# Patient Record
Sex: Female | Born: 1975 | Hispanic: Yes | Marital: Single | Smoking: Never smoker
Health system: Southern US, Community
[De-identification: ages and names within clinical notes are randomized; demographics above are authoritative.]

## PROBLEM LIST (undated history)

## (undated) DIAGNOSIS — F411 Generalized anxiety disorder: Secondary | ICD-10-CM

## (undated) DIAGNOSIS — E119 Type 2 diabetes mellitus without complications: Secondary | ICD-10-CM

## (undated) DIAGNOSIS — F909 Attention-deficit hyperactivity disorder, unspecified type: Secondary | ICD-10-CM

---

## 2013-11-24 ENCOUNTER — Ambulatory Visit: Payer: Self-pay

## 2013-11-24 LAB — CBC WITH DIFFERENTIAL/PLATELET
Basophil %: 1 %
Eosinophil #: 0.1 10*3/uL (ref 0.0–0.7)
HGB: 13.2 g/dL (ref 12.0–16.0)
Lymphocyte #: 2.6 10*3/uL (ref 1.0–3.6)
Lymphocyte %: 36.8 %
MCHC: 34.1 g/dL (ref 32.0–36.0)
Monocyte #: 0.5 x10 3/mm (ref 0.2–0.9)
Monocyte %: 7.8 %
Neutrophil #: 3.7 10*3/uL (ref 1.4–6.5)
Neutrophil %: 52.6 %
Platelet: 202 10*3/uL (ref 150–440)
RBC: 4.73 10*6/uL (ref 3.80–5.20)
RDW: 13.8 % (ref 11.5–14.5)
WBC: 7 10*3/uL (ref 3.6–11.0)

## 2013-11-24 LAB — DRUG SCREEN, URINE
Barbiturates, Ur Screen: NEGATIVE (ref ?–200)
Cocaine Metabolite,Ur ~~LOC~~: NEGATIVE (ref ?–300)
MDMA (Ecstasy)Ur Screen: NEGATIVE (ref ?–500)
Methadone, Ur Screen: NEGATIVE (ref ?–300)
Tricyclic, Ur Screen: NEGATIVE (ref ?–1000)

## 2013-11-24 LAB — COMPREHENSIVE METABOLIC PANEL
Alkaline Phosphatase: 60 U/L
Anion Gap: 8 (ref 7–16)
BUN: 12 mg/dL (ref 7–18)
Bilirubin,Total: 0.3 mg/dL (ref 0.2–1.0)
Calcium, Total: 9.1 mg/dL (ref 8.5–10.1)
Chloride: 101 mmol/L (ref 98–107)
Co2: 30 mmol/L (ref 21–32)
EGFR (African American): >60
Potassium: 4 mmol/L (ref 3.5–5.1)
SGOT(AST): 19 U/L (ref 15–37)
Sodium: 139 mmol/L (ref 136–145)

## 2013-11-24 LAB — URINALYSIS, COMPLETE
Ketone: NEGATIVE
Nitrite: NEGATIVE

## 2013-11-25 LAB — URINE CULTURE

## 2015-04-12 ENCOUNTER — Ambulatory Visit
Admit: 2015-04-12 | Disposition: A | Discharge status: Home / Self Care | Payer: Self-pay | Attending: Family Medicine | Admitting: Family Medicine

## 2015-10-30 ENCOUNTER — Emergency Department
Admission: EM | Admit: 2015-10-30 | Discharge: 2015-10-30 | Discharge status: Against Medical Advice | Payer: Medicaid Other | Attending: Emergency Medicine | Admitting: Emergency Medicine

## 2015-10-30 DIAGNOSIS — R05 Cough: Secondary | ICD-10-CM | POA: Insufficient documentation

## 2015-10-30 NOTE — ED Notes (Signed)
Patient presents with c/o NP cough since Friday. Denies fever. Has been using OTC medications without relief.

## 2015-11-13 ENCOUNTER — Ambulatory Visit
Admission: EM | Admit: 2015-11-13 | Discharge: 2015-11-13 | Disposition: A | Discharge status: Home / Self Care | Payer: Medicaid Other | Attending: Family Medicine | Admitting: Family Medicine

## 2015-11-13 DIAGNOSIS — J029 Acute pharyngitis, unspecified: Secondary | ICD-10-CM

## 2015-11-13 DIAGNOSIS — B349 Viral infection, unspecified: Secondary | ICD-10-CM

## 2015-11-13 HISTORY — DX: Attention-deficit hyperactivity disorder, unspecified type: F90.9

## 2015-11-13 NOTE — Discharge Instructions (Signed)
Alternate tylenol and ibuprofen Gargle warm salt water Drink warm tea with local [place] honey Steamy shower Rest- fluids Return if not improved/resolved in 7-10 days   Pharyngitis Pharyngitis is redness, pain, and swelling (inflammation) of your pharynx.  CAUSES  Pharyngitis is usually caused by infection. Most of the time, these infections are from viruses (viral) and are part of a cold. However, sometimes pharyngitis is caused by bacteria (bacterial). Pharyngitis can also be caused by allergies. Viral pharyngitis may be spread from person to person by coughing, sneezing, and personal items or utensils (cups, forks, spoons, toothbrushes). Bacterial pharyngitis may be spread from person to person by more intimate contact, such as kissing.  SIGNS AND SYMPTOMS  Symptoms of pharyngitis include:   Sore throat.   Tiredness (fatigue).   Low-grade fever.   Headache.  Joint pain and muscle aches.  Skin rashes.  Swollen lymph nodes.  Plaque-like film on throat or tonsils (often seen with bacterial pharyngitis). DIAGNOSIS  Your health care provider will ask you questions about your illness and your symptoms. Your medical history, along with a physical exam, is often all that is needed to diagnose pharyngitis. Sometimes, a rapid strep test is done. Other lab tests may also be done, depending on the suspected cause.  TREATMENT  Viral pharyngitis will usually get better in 3-4 days without the use of medicine. Bacterial pharyngitis is treated with medicines that kill germs (antibiotics).  HOME CARE INSTRUCTIONS   Drink enough water and fluids to keep your urine clear or pale yellow.   Only take over-the-counter or prescription medicines as directed by your health care provider:   If you are prescribed antibiotics, make sure you finish them even if you start to feel better.   Do not take aspirin.   Get lots of rest.   Gargle with 8 oz of salt water ( tsp of salt per 1 qt of  water) as often as every 1-2 hours to soothe your throat.   Throat lozenges (if you are not at risk for choking) or sprays may be used to soothe your throat. SEEK MEDICAL CARE IF:   You have large, tender lumps in your neck.  You have a rash.  You cough up green, yellow-brown, or bloody spit. SEEK IMMEDIATE MEDICAL CARE IF:   Your neck becomes stiff.  You drool or are unable to swallow liquids.  You vomit or are unable to keep medicines or liquids down.  You have severe pain that does not go away with the use of recommended medicines.  You have trouble breathing (not caused by a stuffy nose). MAKE SURE YOU:   Understand these instructions.  Will watch your condition.  Will get help right away if you are not doing well or get worse.   This information is not intended to replace advice given to you by your health care provider. Make sure you discuss any questions you have with your health care provider.   Document Released: 12/01/2005 Document Revised: 09/21/2013 Document Reviewed: 08/08/2013 Elsevier Interactive Patient Education [employer]2016 Elsevier Inc.

## 2015-11-13 NOTE — ED Provider Notes (Signed)
CSN: [id number]     Arrival date & time 11/13/15  1721 History   First MD Initiated Contact with Patient 11/13/15 1912     Chief Complaint  Patient presents with  . Otalgia   (Consider location/radiation/quality/duration/timing/severity/associated sxs/prior Treatment) HPI  39 yo F recent dx Bronchitis and took Zpack  15-20 November. Now reports  fatigue, chills,since returning form holiday 2 days ago.  . Initially presented that this was persistent since the pre-Thanksgiving episode/pre-atb  but on closer review these new symptoms present only 2 days. Anxious  about tenderness left ant chain. Afebrile. Mild malaise,fatigue  Past Medical History  Diagnosis Date  . ADHD (attention deficit hyperactivity disorder)    Past Surgical History  Procedure Laterality Date  . Cesarean section      2001 and 2013   Family History  Problem Relation Age of Onset  . Heart failure Mother   . Cancer Father    Social History  Substance Use Topics  . Smoking status: Never Smoker   . Smokeless tobacco: None  . Alcohol Use: No   OB History    No data available     Review of Systems Constitutional: No fever.  Eyes: No visual changes. WUJ:WJXBJYNWENT:Scratchy  throat. Cardiovascular:Negative for chest pain/palpitations Respiratory: Negative for shortness of breath Gastrointestinal: No abdominal pain. No nausea,vomiting, diarrhea Genitourinary: Negative for dysuria. Normal urination. Musculoskeletal: Negative for back pain. FROM extremities without pain Skin: Negative for rash Neurological: Negative for headache, focal weakness or numbness  Allergies  Penicillins and Sulfa antibiotics  Home Medications   Prior to Admission medications   Medication Sig Start Date End Date Taking? Authorizing Provider  amphetamine-dextroamphetamine (ADDERALL) 10 MG tablet Take 10 mg by mouth daily with breakfast.   Yes Historical Provider, MD   Meds Ordered and Administered this Visit  Medications - No data to  display  BP 109/71 mmHg  Pulse 78  Temp(Src) 97.8 F (36.6 C) (Tympanic)  Resp 16  Ht 5\' 2"  (1.575 m)  Wt 150 lb (68.04 kg)  BMI 27.43 kg/m2  SpO2 100%  LMP 11/13/2015 (Exact Date) No data found.   Physical Exam   VS noted, WNL  GENERAL : NAD HEENT: no pharyngeal erythema,no exudate, no erythema of TMs, mild left ant cervical tenderness RESP: CTA  B , no wheezing, no accessory muscle use CARD: RRR ABD: Not distended NEURO: Good attention, good recall, no gross neuro defecit PSYCH: speech and behavior appropriate  ED Course  Procedures (including critical care time)  Labs Review Labs Reviewed - No data to displaycancelled  Imaging Review No results found.    MDM   1. Viral syndrome   2. Pharyngitis    Plan:  diagnosis reviewed with patient Treat symptoms - gargle-rest -increase fluids  Recommend supportive treatment with cyclic tylenol and ibuprofen Seek additional medical care if symptoms worsen or are not improving Viral syndrome 7-10 days ,common experience.    Rae HalstedLaurie W Lee, PA-C 11/14/15 1252

## 2015-11-13 NOTE — ED Notes (Signed)
Started Sunday with pain behind left ear and left lateral neck. Pain with swallowing and touch

## 2015-11-14 ENCOUNTER — Encounter: Payer: Self-pay | Admitting: Physician Assistant
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
## 2016-06-04 ENCOUNTER — Other Ambulatory Visit: Payer: Self-pay | Admitting: Pediatrics

## 2016-06-04 ENCOUNTER — Ambulatory Visit
Admission: RE | Admit: 2016-06-04 | Discharge: 2016-06-04 | Disposition: A | Discharge status: Home / Self Care | Payer: Medicaid Other | Source: Ambulatory Visit | Attending: Pediatrics | Admitting: Pediatrics

## 2016-06-04 DIAGNOSIS — R1011 Right upper quadrant pain: Secondary | ICD-10-CM | POA: Insufficient documentation

## 2017-11-23 IMAGING — US US ABDOMEN LIMITED
1 series · 14 of 25 positions shown · non-contrast
Comparison: None.

CLINICAL DATA: Right upper quadrant abdominal pain for 4 days

EXAM:
US ABDOMEN LIMITED - RIGHT UPPER QUADRANT

[Series 1: us abdomen limited · 0.13mm/px · 14 of 63 slices shown]
[im 1/63]
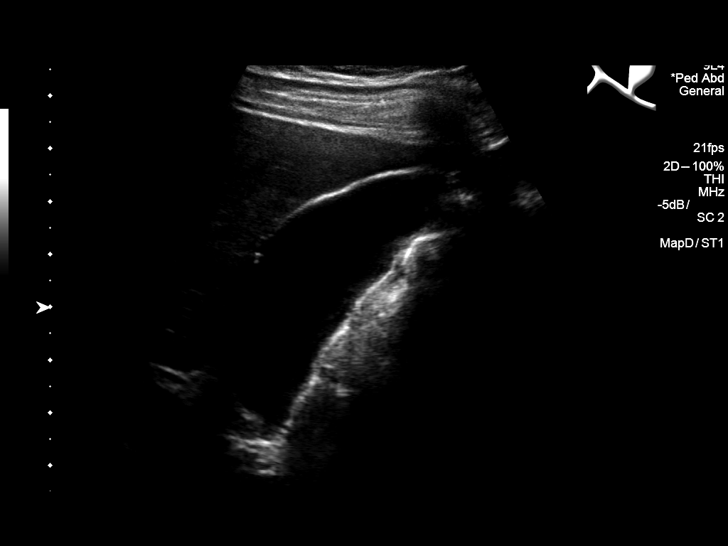
[im 6/63]
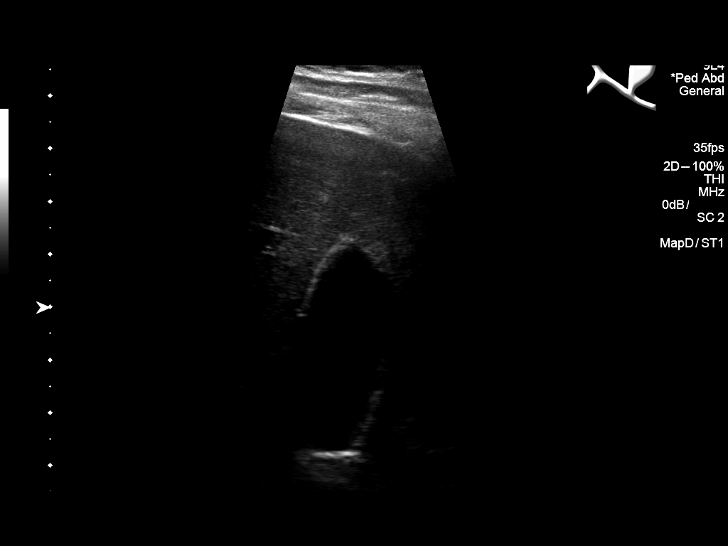
[im 11/63]
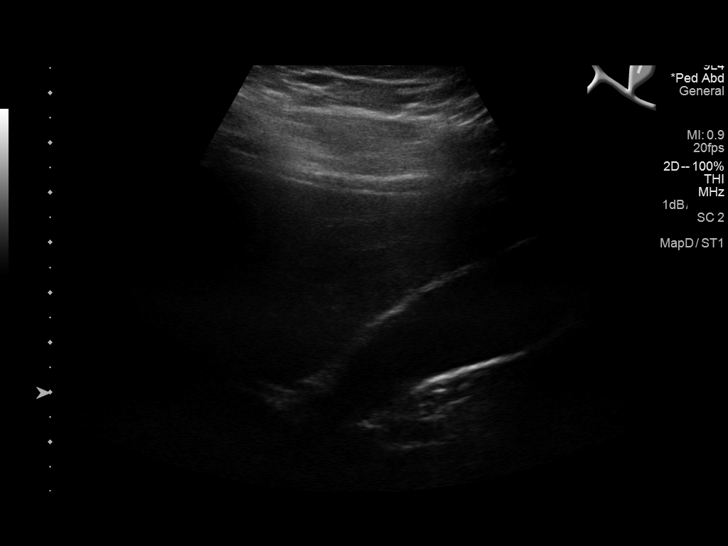
[im 16/63]
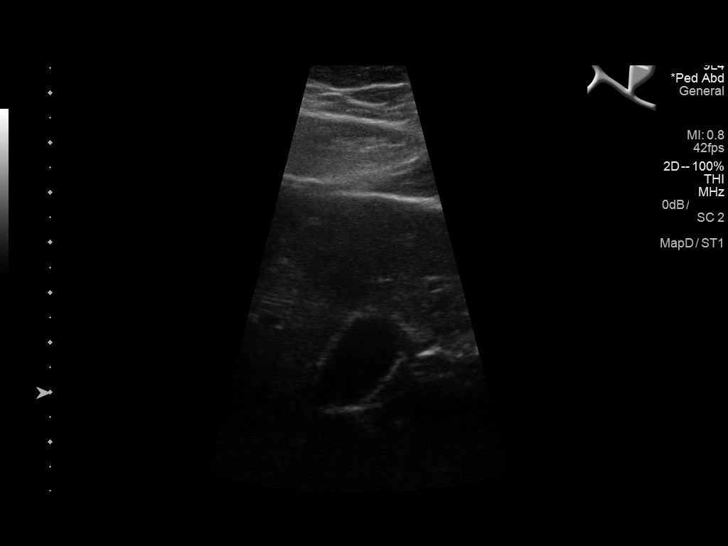
[im 21/63]
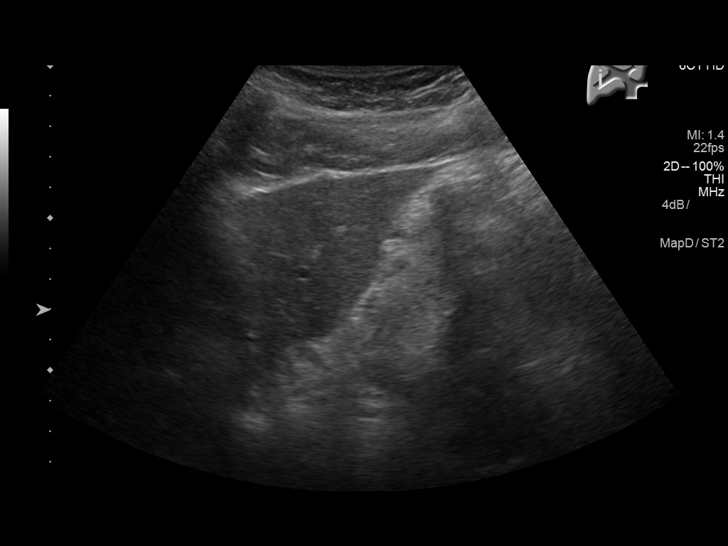
[im 24/63]
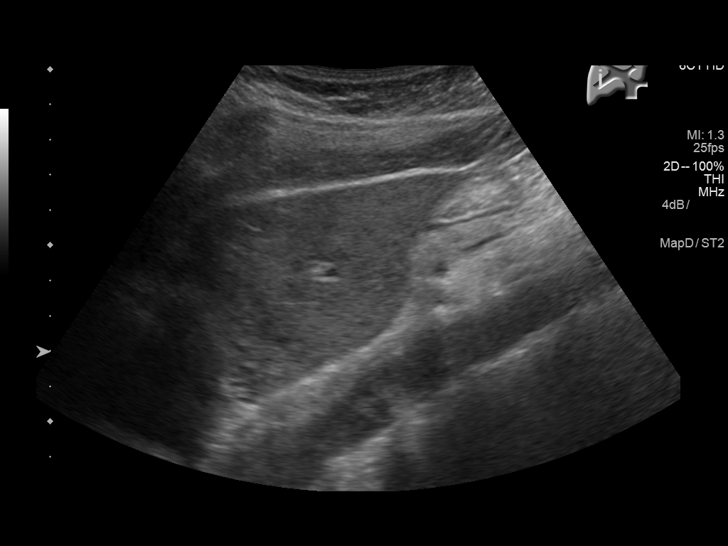
[im 29/63]
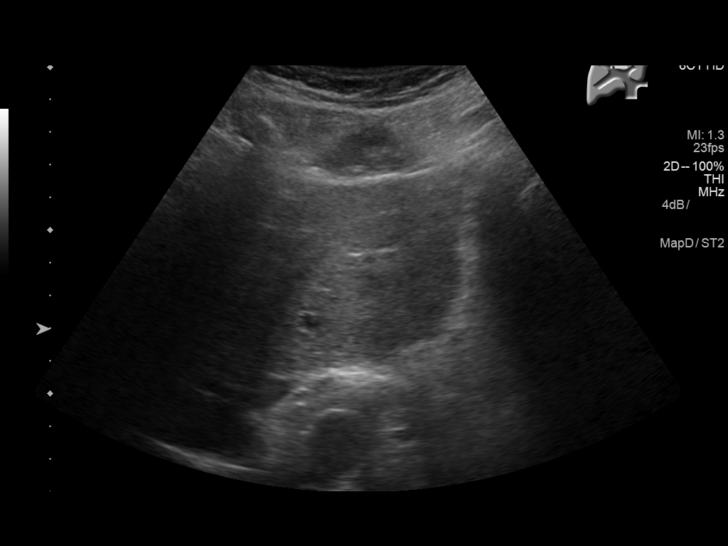
[im 34/63]
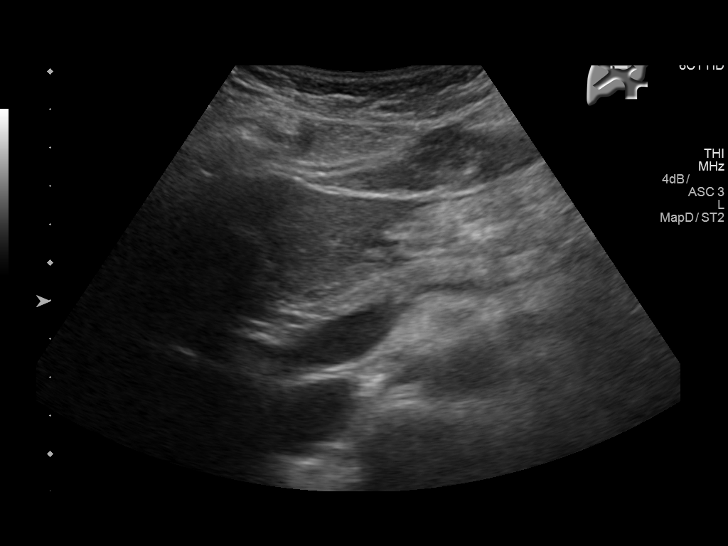
[im 39/63]
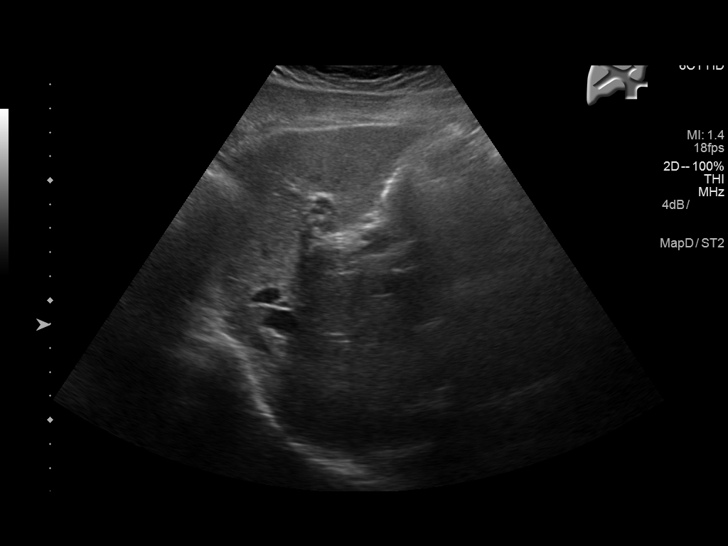
[im 42/63]
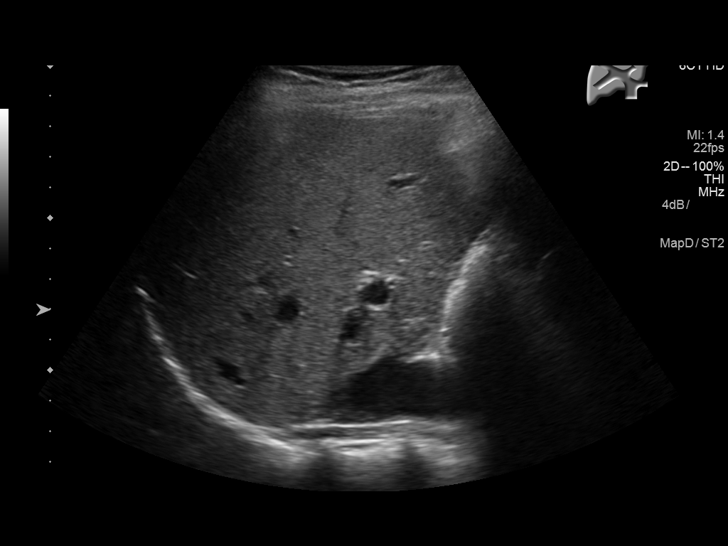
[im 47/63]
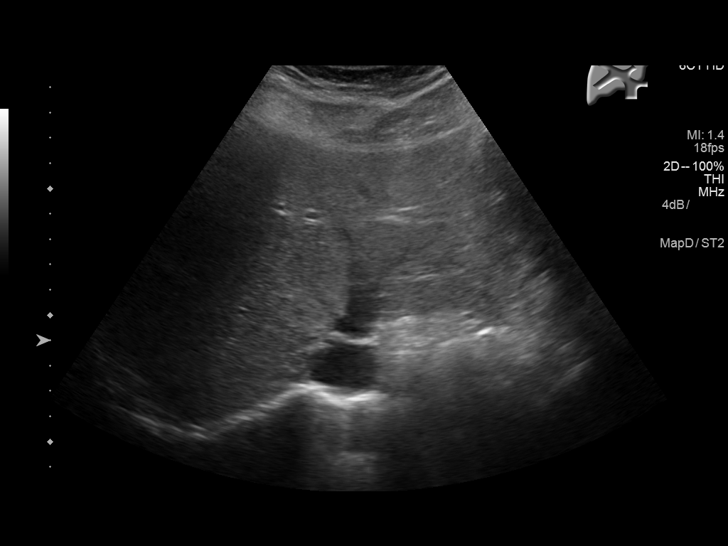
[im 52/63]
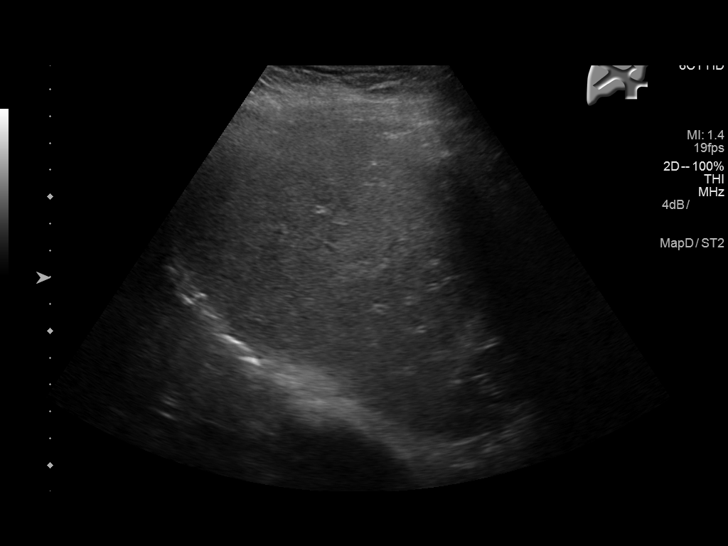
[im 57/63]
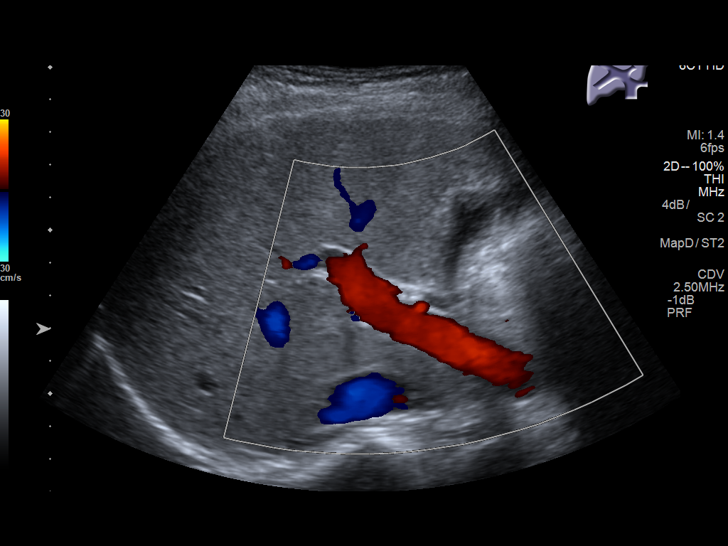
[im 63/63]
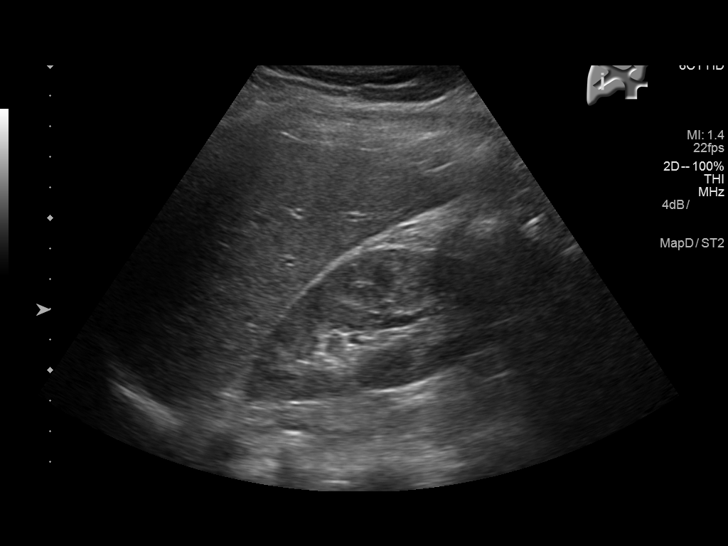

[14 of 25 positions shown; findings below may reference images not displayed]

FINDINGS: Gallbladder:

No gallstones or wall thickening visualized. No sonographic Murphy
sign noted by sonographer.

Common bile duct:

Diameter: Normal caliber, 5 mm

Liver:

No focal lesion identified. Within normal limits in parenchymal
echogenicity.
IMPRESSION: Unremarkable right upper quadrant ultrasound.

## 2018-03-27 ENCOUNTER — Encounter: Payer: Self-pay | Admitting: Gynecology

## 2018-03-27 ENCOUNTER — Other Ambulatory Visit: Payer: Self-pay

## 2018-03-27 ENCOUNTER — Ambulatory Visit
Admission: EM | Admit: 2018-03-27 | Discharge: 2018-03-27 | Disposition: A | Discharge status: Home / Self Care | Payer: PRIVATE HEALTH INSURANCE | Attending: Family Medicine | Admitting: Family Medicine

## 2018-03-27 DIAGNOSIS — T23091A Burn of unspecified degree of multiple sites of right wrist and hand, initial encounter: Secondary | ICD-10-CM | POA: Diagnosis not present

## 2018-03-27 DIAGNOSIS — T65891A Toxic effect of other specified substances, accidental (unintentional), initial encounter: Secondary | ICD-10-CM | POA: Diagnosis not present

## 2018-03-27 DIAGNOSIS — Z23 Encounter for immunization: Secondary | ICD-10-CM | POA: Diagnosis not present

## 2018-03-27 DIAGNOSIS — T23071A Burn of unspecified degree of right wrist, initial encounter: Secondary | ICD-10-CM

## 2018-03-27 MED ORDER — TETANUS-DIPHTH-ACELL PERTUSSIS 5-2.5-18.5 LF-MCG/0.5 IM SUSP
0.5000 mL | Freq: Once | INTRAMUSCULAR | Status: AC
  Administered 2018-03-27: 0.5 mL via INTRAMUSCULAR
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
## 2018-03-27 NOTE — Discharge Instructions (Addendum)
Go directly to Emergency room as discussed.  °

## 2019-12-06 DIAGNOSIS — F902 Attention-deficit hyperactivity disorder, combined type: Secondary | ICD-10-CM | POA: Insufficient documentation

## 2019-12-06 DIAGNOSIS — F411 Generalized anxiety disorder: Secondary | ICD-10-CM | POA: Insufficient documentation

## 2022-11-13 DIAGNOSIS — E119 Type 2 diabetes mellitus without complications: Secondary | ICD-10-CM | POA: Insufficient documentation

## 2023-05-16 ENCOUNTER — Ambulatory Visit (INDEPENDENT_AMBULATORY_CARE_PROVIDER_SITE_OTHER): Payer: Medicaid Other

## 2023-05-16 ENCOUNTER — Ambulatory Visit
Admission: RE | Admit: 2023-05-16 | Discharge: 2023-05-16 | Disposition: A | Discharge status: Home / Self Care | Payer: Medicaid Other | Source: Ambulatory Visit | Attending: Family Medicine | Admitting: Family Medicine

## 2023-05-16 VITALS — BP 127/80 | HR 97 | Temp 98.3°F | Resp 16 | Ht 63.0 in | Wt 170.0 lb

## 2023-05-16 DIAGNOSIS — M79671 Pain in right foot: Secondary | ICD-10-CM

## 2023-05-16 HISTORY — DX: Generalized anxiety disorder: F41.1

## 2023-05-16 HISTORY — DX: Type 2 diabetes mellitus without complications: E11.9

## 2023-05-16 NOTE — ED Triage Notes (Signed)
Pt c/o R foot pain x1 day. States unable to put pressure on foot & denies any injuries.
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
## 2023-05-16 NOTE — Discharge Instructions (Addendum)
I am concerned you may have a stress fracture that is not seen on xray.  Wear the boot and follow up with an orthopedic provider.   If medication was prescribed, stop by the pharmacy to pick up your prescriptions.  For your  pain, Take 1000 mg Tylenol with 600 ibuprofen/Advil 2-3 times a day,  as needed for pain.  Rest and elevate the affected painful area.  Apply cold compresses intermittently, as needed.  As pain recedes, begin normal activities slowly as tolerated.  Follow up with primary care provider or an orthopedic provider, if symptoms persist.  Watch for worsening symptoms such as an increasing weakness or loss of sensation, increasing pain and/or the loss of bladder or bowel function. Should any of these occur, go to the emergency department immediately.

## 2023-05-18 ENCOUNTER — Encounter: Payer: Self-pay | Admitting: Family Medicine

## 2023-05-21 ENCOUNTER — Ambulatory Visit: Payer: Self-pay | Admitting: Family Medicine

## 2024-11-05 ENCOUNTER — Ambulatory Visit
Admission: EM | Admit: 2024-11-05 | Discharge: 2024-11-05 | Disposition: A | Discharge status: Home / Self Care | Payer: Self-pay | Attending: Emergency Medicine | Admitting: Emergency Medicine

## 2024-11-05 ENCOUNTER — Encounter: Payer: Self-pay | Admitting: Emergency Medicine

## 2024-11-05 DIAGNOSIS — J029 Acute pharyngitis, unspecified: Secondary | ICD-10-CM | POA: Insufficient documentation

## 2024-11-05 LAB — POCT RAPID STREP A (OFFICE): Rapid Strep A Screen: NEGATIVE

## 2024-11-05 LAB — POC COVID19/FLU A&B COMBO
Covid Antigen, POC: NEGATIVE
Influenza A Antigen, POC: NEGATIVE
Influenza B Antigen, POC: NEGATIVE

## 2024-11-05 MED ORDER — LIDOCAINE VISCOUS HCL 2 % MT SOLN: 15.0000 mL | Freq: Four times a day (QID) | OROMUCOSAL | 0 refills | Status: AC | PRN

## 2024-11-05 NOTE — ED Provider Notes (Signed)
 [facility]    CSN: [id number] Arrival date & time: 11/05/24  1034      History   Chief Complaint Chief Complaint  Patient presents with   Sore Throat   Otalgia    left    HPI Yolanda Sexton is a 48 y.o. female.   HPI  48 year old female with past medical history significant for type 2 diabetes, generalizing Zaidi disorder, and ADHD presents for evaluation of left ear pain and sore throat that started last night per her report.  No fevers.  She has had some nasal congestion but no nasal discharge and no cough.  She denies any changes in her hearing or drainage from the ear.  No known sick contacts though she has been back and forth to the hospital due to a family member being hospitalized.  Past Medical History:  Diagnosis Date   ADHD (attention deficit hyperactivity disorder)    GAD (generalized anxiety disorder)    Type 2 diabetes mellitus, without long-term current use of insulin [facility])     Patient Active Problem List   Diagnosis Date Noted   Type 2 diabetes mellitus without complication, without long-term current use of insulin (HCC) 11/13/2022   ADHD (attention deficit hyperactivity disorder), combined type 12/06/2019   GAD (generalized anxiety disorder) 12/06/2019    Past Surgical History:  Procedure Laterality Date   CESAREAN SECTION     2001 and 2013    OB History   No obstetric history on file.      Home Medications    Prior to Admission medications   Medication Sig Start Date End Date Taking? Authorizing Provider  DULoxetine (CYMBALTA) 30 MG capsule Take by mouth. 10/14/22 11/05/24 Yes [provider]  lidocaine  (XYLOCAINE ) 2 % solution Use as directed 15 mLs in the mouth or throat every 6 (six) hours as needed for mouth pain. 11/05/24  Yes Bernardino Ditch, NP  lisdexamfetamine (VYVANSE) 50 MG capsule Take by mouth. 07/14/22 11/05/24 Yes [provider]  Multiple Vitamin (MULTIVITAMIN ADULT PO) Take by mouth.   Yes  [provider]    Family History Family History  Problem Relation Age of Onset   Heart failure Mother    Cancer Father     Social History Social History   Tobacco Use   Smoking status: Never   Smokeless tobacco: Never  Vaping Use   Vaping status: Never Used  Substance Use Topics   Alcohol use: No   Drug use: Never     Allergies   Penicillins and Sulfa antibiotics   Review of Systems Review of Systems  Constitutional:  Negative for fever.  HENT:  Positive for congestion, ear pain and sore throat. Negative for ear discharge, hearing loss and rhinorrhea.   Respiratory:  Negative for cough.      Physical Exam Triage Vital Signs ED Triage Vitals  Encounter Vitals Group     BP --      Girls Systolic BP Percentile --      Girls Diastolic BP Percentile --      Boys Systolic BP Percentile --      Boys Diastolic BP Percentile --      Pulse --      Resp --      Temp --      Temp src --      SpO2 --      Weight 11/05/24 1126 169 lb 15.6 oz (77.1 kg)     Height 11/05/24 1126 5' 3 (  1.6 m)     Head Circumference --      Peak Flow --      Pain Score 11/05/24 1125 9     Pain Loc --      Pain Education --      Exclude from Growth Chart --    No data found.  Updated Vital Signs BP 132/64 (BP Location: Right Arm)   Pulse 95   Temp 98.5 F (36.9 C) (Oral)   Resp 16   Ht 5' 3 (1.6 m)   Wt 169 lb 15.6 oz (77.1 kg)   LMP 10/24/2024   SpO2 94%   BMI 30.11 kg/m   Visual Acuity Right Eye Distance:   Left Eye Distance:   Bilateral Distance:    Right Eye Near:   Left Eye Near:    Bilateral Near:     Physical Exam Vitals and nursing note reviewed.  Constitutional:      Appearance: Normal appearance. She is not ill-appearing.  HENT:     Head: Normocephalic and atraumatic.     Right Ear: Tympanic membrane, ear canal and external ear normal. There is no impacted cerumen.     Left Ear: Tympanic membrane, ear canal and external ear normal. There is  no impacted cerumen.     Nose: Congestion present. No rhinorrhea.     Comments: Patient mucosa is edematous and erythematous without appreciable discharge.    Mouth/Throat:     Mouth: Mucous membranes are moist.     Pharynx: Oropharynx is clear. Posterior oropharyngeal erythema present. No oropharyngeal exudate.     Comments: Tonsillar pillars are 2+ edematous and erythematous without exudate. Neck:     Comments: Tenderness to palpation of the anterior cervical region without appreciable lymphadenopathy. Musculoskeletal:     Cervical back: Normal range of motion and neck supple. Tenderness present.  Lymphadenopathy:     Cervical: No cervical adenopathy.  Skin:    General: Skin is warm and dry.     Capillary Refill: Capillary refill takes less than 2 seconds.     Findings: No erythema or rash.  Neurological:     General: No focal deficit present.     Mental Status: She is alert and oriented to person, place, and time.      UC Treatments / Results  Labs (all labs ordered are listed, but only abnormal results are displayed) Labs Reviewed  POC COVID19/FLU A&B COMBO - Normal  CULTURE, GROUP A STREP [facility])  POCT RAPID STREP A (OFFICE)    EKG   Radiology No results found.  Procedures Procedures (including critical care time)  Medications Ordered in UC Medications - No data to display  Initial Impression / Assessment and Plan / UC Course  I have reviewed the triage vital signs and the nursing notes.  Pertinent labs & imaging results that were available during my care of the patient were reviewed by me and considered in my medical decision making (see chart for details).   Patient is a pleasant, nontoxic-appearing 48 year old female presenting for evaluation of left ear pain and sore throat.  Triage note states that her symptoms began 3 days ago but the patient reports that they actually began last night.  She has been going back and forth to the hospital due to the fact that  one of her failure was has been hospitalized.  Her most significant plaint is sore throat pain, more so on the left than the right that radiates into her left ear.  A  rapid strep was collected at triage and is pending.  If the rapid strep is negative I will send the swab for culture as well as order a COVID and flu antigen test.  Rapid strep is negative.  I will send swab for culture.  I will order a COVID and flu antigen test.  COVID and flu antigen test are negative.  I will discharge patient on the diagnosis of viral pharyngitis with prescription for viscous lidocaine  that she can gargle and spit every 6 hours to help with sore throat pain.  She may also use over-the-counter Chloraseptic or Sucrets lozenges, over-the-counter Tylenol and/or ibuprofen, and salt water gargles as often as she likes.  Throat culture came back positive for moderate beta-hemolytic Streptococcus not group A.  Patient has hive allergy to penicillin so I will send azithromycin  to the pharmacy.   Final Clinical Impressions(s) / UC Diagnoses   Final diagnoses:  Sore throat  Viral pharyngitis     Discharge Instructions      Your testing today was negative for strep, COVID, and influenza.  We will send your strep swab for culture.  Use the viscous lidocaine , 1 tablespoon every 6 hours, gargle and spit to help with sore throat pain.  Gargle with warm salt water 2-3 times a day to soothe your throat, aid in pain relief, and aid in healing.  Take over-the-counter Tylenol and/or ibuprofen according to the package instructions as needed for pain.  You can also use Chloraseptic or Sucrets lozenges, 1 lozenge every 2 hours as needed for throat pain.  If you develop any new or worsening symptoms return for reevaluation.      ED Prescriptions     Medication Sig Dispense Auth. Provider   lidocaine  (XYLOCAINE ) 2 % solution Use as directed 15 mLs in the mouth or throat every 6 (six) hours as needed for mouth pain.  100 mL Bernardino Ditch, NP      PDMP not reviewed this encounter.   Bernardino Ditch, NP 11/05/24 1217    Bernardino Ditch, NP 11/07/24 [phone]

## 2024-11-05 NOTE — Discharge Instructions (Addendum)
 Your testing today was negative for strep, COVID, and influenza.  We will send your strep swab for culture.  Use the viscous lidocaine , 1 tablespoon every 6 hours, gargle and spit to help with sore throat pain.  Gargle with warm salt water 2-3 times a day to soothe your throat, aid in pain relief, and aid in healing.  Take over-the-counter Tylenol and/or ibuprofen according to the package instructions as needed for pain.  You can also use Chloraseptic or Sucrets lozenges, 1 lozenge every 2 hours as needed for throat pain.  If you develop any new or worsening symptoms return for reevaluation.

## 2024-11-05 NOTE — ED Triage Notes (Signed)
 Pt c/o left ear and throat pain. Started about 3 days ago. Denies fever. She states she could not sleep last night due to the pain.

## 2024-11-07 ENCOUNTER — Telehealth: Payer: Self-pay | Admitting: Emergency Medicine

## 2024-11-07 LAB — CULTURE, GROUP A STREP (THRC)

## 2024-11-07 MED ORDER — AZITHROMYCIN 250 MG PO TABS: 250.0000 mg | ORAL_TABLET | Freq: Every day | ORAL | 0 refills | Status: AC

## 2024-11-07 NOTE — Telephone Encounter (Signed)
 Throat came back positive for beta-hemolytic Streptococcus, not group A.  Azithromycin  sent to the pharmacy.

## 2024-11-08 ENCOUNTER — Ambulatory Visit (HOSPITAL_COMMUNITY): Payer: Self-pay
